# Patient Record
Sex: Female | Born: 1990 | Race: White | Hispanic: No | Marital: Single | Smoking: Current every day smoker
Health system: Southern US, Community
[De-identification: ages and names within clinical notes are randomized; demographics above are authoritative.]

## PROBLEM LIST (undated history)

## (undated) DIAGNOSIS — F32A Depression, unspecified: Secondary | ICD-10-CM

## (undated) DIAGNOSIS — F329 Major depressive disorder, single episode, unspecified: Secondary | ICD-10-CM

---

## 2012-01-25 ENCOUNTER — Emergency Department: Payer: Self-pay | Admitting: *Deleted

## 2012-01-25 LAB — URINALYSIS, COMPLETE
Bilirubin,UR: NEGATIVE
Nitrite: NEGATIVE
Protein: 100
RBC,UR: 182 /HPF (ref 0–5)

## 2014-09-16 ENCOUNTER — Ambulatory Visit: Payer: Self-pay | Admitting: Internal Medicine

## 2015-07-18 ENCOUNTER — Emergency Department (HOSPITAL_BASED_OUTPATIENT_CLINIC_OR_DEPARTMENT_OTHER)
Admission: EM | Admit: 2015-07-18 | Discharge: 2015-07-18 | Disposition: A | Discharge status: Home / Self Care | Payer: Self-pay | Attending: Emergency Medicine | Admitting: Emergency Medicine

## 2015-07-18 ENCOUNTER — Encounter (HOSPITAL_BASED_OUTPATIENT_CLINIC_OR_DEPARTMENT_OTHER): Payer: Self-pay | Admitting: Emergency Medicine

## 2015-07-18 DIAGNOSIS — M542 Cervicalgia: Secondary | ICD-10-CM | POA: Insufficient documentation

## 2015-07-18 DIAGNOSIS — N390 Urinary tract infection, site not specified: Secondary | ICD-10-CM | POA: Insufficient documentation

## 2015-07-18 DIAGNOSIS — Z3202 Encounter for pregnancy test, result negative: Secondary | ICD-10-CM | POA: Insufficient documentation

## 2015-07-18 DIAGNOSIS — F329 Major depressive disorder, single episode, unspecified: Secondary | ICD-10-CM | POA: Insufficient documentation

## 2015-07-18 DIAGNOSIS — Z720 Tobacco use: Secondary | ICD-10-CM | POA: Insufficient documentation

## 2015-07-18 HISTORY — DX: Depression, unspecified: F32.A

## 2015-07-18 HISTORY — DX: Major depressive disorder, single episode, unspecified: F32.9

## 2015-07-18 LAB — PREGNANCY, URINE: Preg Test, Ur: NEGATIVE

## 2015-07-18 LAB — URINALYSIS, ROUTINE W REFLEX MICROSCOPIC
BILIRUBIN URINE: NEGATIVE
GLUCOSE, UA: NEGATIVE mg/dL
Ketones, ur: 15 mg/dL — AB
Nitrite: POSITIVE — AB
PH: 6 (ref 5.0–8.0)
Protein, ur: NEGATIVE mg/dL
Specific Gravity, Urine: 1.019 (ref 1.005–1.030)
Urobilinogen, UA: 0.2 mg/dL (ref 0.0–1.0)

## 2015-07-18 LAB — URINE MICROSCOPIC-ADD ON

## 2015-07-18 MED ORDER — LIDOCAINE HCL (PF) 1 % IJ SOLN
INTRAMUSCULAR | Status: AC
  Administered 2015-07-18: 2.1 mL
  Filled 2015-07-18: qty 5

## 2015-07-18 MED ORDER — CEPHALEXIN 500 MG PO CAPS: 500.0000 mg | ORAL_CAPSULE | Freq: Two times a day (BID) | ORAL | Status: DC

## 2015-07-18 MED ORDER — IBUPROFEN 800 MG PO TABS
800.0000 mg | ORAL_TABLET | Freq: Once | ORAL | Status: AC
  Administered 2015-07-18: 800 mg via ORAL
  Filled 2015-07-18: qty 1

## 2015-07-18 MED ORDER — CEFTRIAXONE SODIUM 1 G IJ SOLR
1.0000 g | Freq: Once | INTRAMUSCULAR | Status: AC
  Administered 2015-07-18: 1 g via INTRAMUSCULAR
  Filled 2015-07-18: qty 10

## 2015-07-18 NOTE — ED Provider Notes (Signed)
CSN: [id number]     Arrival date & time 07/18/15  2201 History  By signing my name below, I, Brenda Sandoval, attest that this documentation has been prepared under the direction and in the presence of Shon Batonourtney F Shariece Viveiros, MD. Electronically Signed: Murriel HopperAlec Sandoval, ED Scribe. 07/18/2015. 10:33 PM.    Chief Complaint  Patient presents with  . Back Pain      The history is provided by the patient. No language interpreter was used.   HPI Comments: Brenda Sandoval is a 24 y.o. female who presents to the Emergency Department complaining of constant, worsening 8/10 right-sided lower back pain. Denies dysuria or hematuria. Pt reports she has pain in her lower back with ambulation. Pt denies any tingling or numbness in association with her lower back pain. Pt denies sore throat, cough, congestion, dysuria, fever. Does report pain over the left side of her neck. Worse with movement. Feels like she might have swollen nodes. Pt states her last menstrual period was 10/14. Pt denies any current medical problems, and denies having any allergies to medications.   Past Medical History  Diagnosis Date  . Depression    History reviewed. No pertinent past surgical history. History reviewed. No pertinent family history. Social History  Substance Use Topics  . Smoking status: Current Every Day Smoker  . Smokeless tobacco: None  . Alcohol Use: No   OB History    No data available     Review of Systems  Constitutional: Negative for unexpected weight change.  HENT: Negative for congestion and sore throat.   Respiratory: Negative for choking.   Musculoskeletal: Positive for myalgias and neck pain.  Neurological: Negative for numbness.  All other systems reviewed and are negative.     Allergies  Review of patient's allergies indicates no known allergies.  Home Medications   Prior to Admission medications   Medication Sig Start Date End Date Taking? Authorizing Provider  Citalopram Hydrobromide  (CELEXA PO) Take by mouth.   Yes Historical Provider, MD  cephALEXin (KEFLEX) 500 MG capsule Take 1 capsule (500 mg total) by mouth 2 (two) times daily. 07/18/15   Shon Batonourtney F Saba Gomm, MD   BP 121/91 mmHg  Pulse 90  Temp(Src) 98 F (36.7 C) (Oral)  Resp 18  Ht 5\' 6"  (1.676 m)  Wt 147 lb (66.679 kg)  BMI 23.74 kg/m2  SpO2 100%  LMP 06/24/2015 Physical Exam  Constitutional: She is oriented to person, place, and time. She appears well-developed and well-nourished. No distress.  HENT:  Head: Normocephalic and atraumatic.  Mouth/Throat: Oropharynx is clear and moist.  Eyes: Pupils are equal, round, and reactive to light.  Neck: Normal range of motion. Neck supple.  No adenopathy noted, tenderness to palpation left neck, thyroid appears normal size  Cardiovascular: Normal rate, regular rhythm and normal heart sounds.   No murmur heard. Pulmonary/Chest: Effort normal and breath sounds normal. No respiratory distress. She has no wheezes.  Abdominal: Soft. There is no tenderness.  Genitourinary:  No CVA tenderness  Musculoskeletal:  No midline tenderness to palpation, no tenderness to palpation to the right paraspinous muscle region  Neurological: She is alert and oriented to person, place, and time.  Skin: Skin is warm and dry.  Psychiatric: She has a normal mood and affect.  Nursing note and vitals reviewed.   ED Course  Procedures (including critical care time)  DIAGNOSTIC STUDIES: Oxygen Saturation is 100% on room air, normal by my interpretation.    COORDINATION OF CARE: 10:32 PM  Discussed treatment plan with pt at bedside and pt agreed to plan.   Labs Review Labs Reviewed  URINALYSIS, ROUTINE W REFLEX MICROSCOPIC (NOT AT [facility]) - Abnormal; Notable for the following:    APPearance CLOUDY (*)    Hgb urine dipstick MODERATE (*)    Ketones, ur 15 (*)    Nitrite POSITIVE (*)    Leukocytes, UA MODERATE (*)    All other components within normal limits  URINE MICROSCOPIC-ADD ON  - Abnormal; Notable for the following:    Squamous Epithelial / LPF FEW (*)    Bacteria, UA MANY (*)    All other components within normal limits  URINE CULTURE  PREGNANCY, URINE    Imaging Review No results found. I have personally reviewed and evaluated these images and lab results as part of my medical decision-making.   EKG Interpretation None      MDM   Final diagnoses:  UTI (lower urinary tract infection)    Patient presents with lower back pain. Onset within the last 24 hours. Denies any other symptoms. Denies injury. Exam is largely unremarkable. Patient is also complaining of left neck pain. No masses or adenopathy noted. Patient denies any symptoms suggestive of new thyroid dysfunction. Urinalysis is nitrite positive with 2150 white cells and many bacteria. Patient's back pain could be related to UTI. She has no CVA tenderness and is afebrile. Low suspicion for pyelonephritis at this time. We'll send urine culture. Keflex twice a day for 7 days. Regarding patient's neck pain. She does not have a primary physician. We'll give the patient follow-up information for [facility].  After history, exam, and medical workup I feel the patient has been appropriately medically screened and is safe for discharge home. Pertinent diagnoses were discussed with the patient. Patient was given return precautions.  I personally performed the services described in this documentation, which was scribed in my presence. The recorded information has been reviewed and is accurate.     Shon Baton, MD 07/18/15 [phone]

## 2015-07-18 NOTE — Discharge Instructions (Signed)
You were seen today for back pain. U have evidence of a urinary tract infection. He will be treated with antibiotics. Otherwise complaining of swelling in her throat. Your thyroid gland does not appear enlarged; however, it is reasonable to have further evaluation with thyroid studies and potential thyroid imaging.  You will be given the number for [facility].  Urinary Tract Infection Urinary tract infections (UTIs) can develop anywhere along your urinary tract. Your urinary tract is your body's drainage system for removing wastes and extra water. Your urinary tract includes two kidneys, two ureters, a bladder, and a urethra. Your kidneys are a pair of bean-shaped organs. Each kidney is about the size of your fist. They are located below your ribs, one on each side of your spine. CAUSES Infections are caused by microbes, which are microscopic organisms, including fungi, viruses, and bacteria. These organisms are so small that they can only be seen through a microscope. Bacteria are the microbes that most commonly cause UTIs. SYMPTOMS  Symptoms of UTIs may vary by age and gender of the patient and by the location of the infection. Symptoms in young women typically include a frequent and intense urge to urinate and a painful, burning feeling in the bladder or urethra during urination. Older women and men are more likely to be tired, shaky, and weak and have muscle aches and abdominal pain. A fever may mean the infection is in your kidneys. Other symptoms of a kidney infection include pain in your back or sides below the ribs, nausea, and vomiting. DIAGNOSIS To diagnose a UTI, your caregiver will ask you about your symptoms. Your caregiver will also ask you to provide a urine sample. The urine sample will be tested for bacteria and white blood cells. White blood cells are made by your body to help fight infection. TREATMENT  Typically, UTIs can be treated with medication. Because most UTIs are caused by a  bacterial infection, they usually can be treated with the use of antibiotics. The choice of antibiotic and length of treatment depend on your symptoms and the type of bacteria causing your infection. HOME CARE INSTRUCTIONS  If you were prescribed antibiotics, take them exactly as your caregiver instructs you. Finish the medication even if you feel better after you have only taken some of the medication.  Drink enough water and fluids to keep your urine clear or pale yellow.  Avoid caffeine, tea, and carbonated beverages. They tend to irritate your bladder.  Empty your bladder often. Avoid holding urine for long periods of time.  Empty your bladder before and after sexual intercourse.  After a bowel movement, women should cleanse from front to back. Use each tissue only once. SEEK MEDICAL CARE IF:   You have back pain.  You develop a fever.  Your symptoms do not begin to resolve within 3 days. SEEK IMMEDIATE MEDICAL CARE IF:   You have severe back pain or lower abdominal pain.  You develop chills.  You have nausea or vomiting.  You have continued burning or discomfort with urination. MAKE SURE YOU:   Understand these instructions.  Will watch your condition.  Will get help right away if you are not doing well or get worse.   This information is not intended to replace advice given to you by your health care provider. Make sure you discuss any questions you have with your health care provider.   Document Released: 06/06/2005 Document Revised: 05/18/2015 Document Reviewed: 10/05/2011 Elsevier Interactive Patient Education [employer]2016 Elsevier Inc.

## 2015-07-18 NOTE — ED Notes (Signed)
Lower back pain started yesterday getting worse today, denies injury

## 2015-07-21 LAB — URINE CULTURE

## 2015-07-22 ENCOUNTER — Telehealth (HOSPITAL_BASED_OUTPATIENT_CLINIC_OR_DEPARTMENT_OTHER): Payer: Self-pay | Admitting: Emergency Medicine

## 2015-07-22 NOTE — Telephone Encounter (Signed)
Post ED Visit - Positive Culture Follow-up  Culture report reviewed by antimicrobial stewardship pharmacist:  []  Brenda Sandoval, Pharm.D. []  Brenda Sandoval, Pharm.D., BCPS []  Brenda Sandoval, Pharm.D. []  Brenda Sandoval, Pharm.D., BCPS []  MelbourneMinh Sandoval, [address] BoulevardPharm.D., BCPS, AAHIVP []  Brenda Sandoval, Pharm.D., BCPS, AAHIVP []  Brenda Sandoval, Pharm.D. [x]  Brenda Sandoval, [place].  Positive urine culture Klebsiella Treated with Cephalexin, organism sensitive to the same and no further patient follow-up is required at this time.  Brenda Sandoval, Brenda Sandoval 07/22/2015, 10:02 AM

## 2015-11-22 ENCOUNTER — Emergency Department (HOSPITAL_BASED_OUTPATIENT_CLINIC_OR_DEPARTMENT_OTHER)
Admission: EM | Admit: 2015-11-22 | Discharge: 2015-11-23 | Disposition: A | Discharge status: Home / Self Care | Payer: Self-pay | Attending: Emergency Medicine | Admitting: Emergency Medicine

## 2015-11-22 ENCOUNTER — Encounter (HOSPITAL_BASED_OUTPATIENT_CLINIC_OR_DEPARTMENT_OTHER): Payer: Self-pay | Admitting: *Deleted

## 2015-11-22 DIAGNOSIS — Z3202 Encounter for pregnancy test, result negative: Secondary | ICD-10-CM | POA: Insufficient documentation

## 2015-11-22 DIAGNOSIS — Z792 Long term (current) use of antibiotics: Secondary | ICD-10-CM | POA: Insufficient documentation

## 2015-11-22 DIAGNOSIS — F17210 Nicotine dependence, cigarettes, uncomplicated: Secondary | ICD-10-CM | POA: Insufficient documentation

## 2015-11-22 DIAGNOSIS — F329 Major depressive disorder, single episode, unspecified: Secondary | ICD-10-CM | POA: Insufficient documentation

## 2015-11-22 DIAGNOSIS — F419 Anxiety disorder, unspecified: Secondary | ICD-10-CM | POA: Insufficient documentation

## 2015-11-22 DIAGNOSIS — F32A Depression, unspecified: Secondary | ICD-10-CM

## 2015-11-22 LAB — RAPID URINE DRUG SCREEN, HOSP PERFORMED
Amphetamines: NOT DETECTED
BARBITURATES: NOT DETECTED
Benzodiazepines: POSITIVE — AB
Cocaine: NOT DETECTED
OPIATES: NOT DETECTED
Tetrahydrocannabinol: POSITIVE — AB

## 2015-11-22 LAB — PREGNANCY, URINE: Preg Test, Ur: NEGATIVE

## 2015-11-22 NOTE — ED Provider Notes (Signed)
CSN: [id number]     Arrival date & time 11/22/15  2201 History  By signing my name below, I, Bethel Born, attest that this documentation has been prepared under the direction and in the presence of Paula Libra, MD. Electronically Signed: Bethel Born, ED Scribe. 11/22/2015. 11:29 PM   Chief Complaint  Patient presents with  . Depression   The history is provided by the patient. No language interpreter was used.   Brenda Sandoval is a 25 y.o. female with history of anxiety and depression who presents to the Emergency Department complaining of worsening depression. She was diagnosed with depression and anxiety 3 years ago and took Celexa and Xanax for 8 months after her diagnoses but has been unable to afford the physician visits and medications for the past two years. Pt states that 3 nights ago she got drunk and realized how bad her depression had gotten.  Associated symptoms include difficulty finding pleasure in typically enjoyable activities, and insomnia. She has trouble falling asleep but she says in bed for most of the day. Pt denies SI, HI, hallucinations. abdominal pain, nausea, vomiting, and any other physical complaint.   Past Medical History  Diagnosis Date  . Depression    History reviewed. No pertinent past surgical history. No family history on file. Social History  Substance Use Topics  . Smoking status: Current Every Day Smoker -- 0.50 packs/day    Types: Cigarettes  . Smokeless tobacco: None  . Alcohol Use: No   OB History    No data available     Review of Systems  10 Systems reviewed and all are negative for acute change except as noted in the HPI.  Allergies  Review of patient's allergies indicates no known allergies.  Home Medications   Prior to Admission medications   Medication Sig Start Date End Date Taking? Authorizing Provider  cephALEXin (KEFLEX) 500 MG capsule Take 1 capsule (500 mg total) by mouth 2 (two) times daily. 07/18/15   Shon Baton, MD  Citalopram Hydrobromide (CELEXA PO) Take by mouth.    Historical Provider, MD   BP 137/71 mmHg  Pulse 68  Temp(Src) 98.9 F (37.2 C) (Oral)  Resp 18  Ht  (1.651 m)  Wt 130 lb (58.968 kg)  BMI 21.63 kg/m2  SpO2 100%  LMP 11/08/2015 Physical Exam  Nursing note and vitals reviewed. General: Well-developed, well-nourished female in no acute distress; appearance consistent with age of record HENT: normocephalic; atraumatic Eyes: pupils equal, round and reactive to light; extraocular muscles intact Neck: supple Heart: regular rate and rhythm; no murmurs, rubs or gallops Lungs: clear to auscultation bilaterally Abdomen: soft; nondistended; nontender; no masses or hepatosplenomegaly; bowel sounds present Extremities: No deformity; full range of motion; pulses normal Neurologic: Awake, alert and oriented; motor function intact in all extremities and symmetric; no facial droop Skin: Warm and dry Psychiatric: Depressed mood with congruent affect; No SI or HI  ED Course  Procedures (including critical care time) DIAGNOSTIC STUDIES: Oxygen Saturation is 100% on RA,  normal by my interpretation.    COORDINATION OF CARE: 11:27 PM Discussed treatment plan which includes lab work and TTS consult with pt at bedside and pt agreed to plan.    MDM   Nursing notes and vitals signs, including pulse oximetry, reviewed.  Summary of this visit's results, reviewed by myself:  Labs:  Results for orders placed or performed during the hospital encounter of 11/22/15 (from the past 24 hour(s))  Urine rapid drug  screen (hosp performed)     Status: Abnormal   Collection Time: 11/22/15 11:04 PM  Result Value Ref Range   Opiates NONE DETECTED NONE DETECTED   Cocaine NONE DETECTED NONE DETECTED   Benzodiazepines POSITIVE (A) NONE DETECTED   Amphetamines NONE DETECTED NONE DETECTED   Tetrahydrocannabinol POSITIVE (A) NONE DETECTED   Barbiturates NONE DETECTED NONE DETECTED   Pregnancy, urine     Status: None   Collection Time: 11/22/15 11:04 PM  Result Value Ref Range   Preg Test, Ur NEGATIVE NEGATIVE  CBC with Differential/Platelet     Status: Abnormal   Collection Time: 11/23/15  2:27 AM  Result Value Ref Range   WBC 8.6 4.0 - 10.5 K/uL   RBC 3.88 3.87 - 5.11 MIL/uL   Hemoglobin 11.9 (L) 12.0 - 15.0 g/dL   HCT 34.735.8 (L) 42.536.0 - 95.646.0 %   MCV 92.3 78.0 - 100.0 fL   MCH 30.7 26.0 - 34.0 pg   MCHC 33.2 30.0 - 36.0 g/dL   RDW 38.713.0 56.411.5 - 33.215.5 %   Platelets 263 150 - 400 K/uL   Neutrophils Relative % 64 %   Neutro Abs 5.6 1.7 - 7.7 K/uL   Lymphocytes Relative 28 %   Lymphs Abs 2.4 0.7 - 4.0 K/uL   Monocytes Relative 7 %   Monocytes Absolute 0.6 0.1 - 1.0 K/uL   Eosinophils Relative 1 %   Eosinophils Absolute 0.1 0.0 - 0.7 K/uL   Basophils Relative 0 %   Basophils Absolute 0.0 0.0 - 0.1 K/uL  Basic metabolic panel     Status: Abnormal   Collection Time: 11/23/15  2:27 AM  Result Value Ref Range   Sodium 137 135 - 145 mmol/L   Potassium 3.5 3.5 - 5.1 mmol/L   Chloride 105 101 - 111 mmol/L   CO2 23 22 - 32 mmol/L   Glucose, Bld 101 (H) 65 - 99 mg/dL   BUN 10 6 - 20 mg/dL   Creatinine, Ser 9.510.56 0.44 - 1.00 mg/dL   Calcium 8.8 (L) 8.9 - 10.3 mg/dL   GFR calc non Af Amer >60 >60 mL/min   GFR calc Af Amer >60 >60 mL/min   Anion gap 9 5 - 15  Ethanol     Status: None   Collection Time: 11/23/15  2:27 AM  Result Value Ref Range   Alcohol, Ethyl (B) <5 <5 mg/dL   8:844:37 AM The physician assistant from TTS who evaluated the patient elicited an admission that the patient, when intoxicated 3 nights ago, apparently tied a cord to a light fixture. The patient does not recall doing this but does recall finding the cord the next morning. Involuntary commitment was advised by the physician assistant. On further evaluation of the patient by myself she repeatedly denies any current thoughts of suicide or self-harm. She repeats that her reason for coming was to get  restarted on her antidepressant and for referral to an appropriate behavioral health professional.  I do not believe that the patient requires involuntary commitment. We will have her contract for safety. We will restart her on Celexa and provide outpatient psychiatric resources.   I personally performed the services described in this documentation, which was scribed in my presence. The recorded information has been reviewed and is accurate.   Paula LibraJohn Demetrios Byron, MD 11/23/15 [phone]

## 2015-11-22 NOTE — ED Notes (Signed)
Depressed for over a year. Denies suicide. States she is not sleeping and has increased anxiety. Drove herself here. States she has a hx of depression. She has been off depression medication x 2 years due to no insurance.

## 2015-11-23 LAB — CBC WITH DIFFERENTIAL/PLATELET
BASOS PCT: 0 %
Basophils Absolute: 0 10*3/uL (ref 0.0–0.1)
EOS ABS: 0.1 10*3/uL (ref 0.0–0.7)
Eosinophils Relative: 1 %
HEMATOCRIT: 35.8 % — AB (ref 36.0–46.0)
HEMOGLOBIN: 11.9 g/dL — AB (ref 12.0–15.0)
LYMPHS ABS: 2.4 10*3/uL (ref 0.7–4.0)
Lymphocytes Relative: 28 %
MCH: 30.7 pg (ref 26.0–34.0)
MCHC: 33.2 g/dL (ref 30.0–36.0)
MCV: 92.3 fL (ref 78.0–100.0)
MONO ABS: 0.6 10*3/uL (ref 0.1–1.0)
MONOS PCT: 7 %
NEUTROS ABS: 5.6 10*3/uL (ref 1.7–7.7)
NEUTROS PCT: 64 %
Platelets: 263 10*3/uL (ref 150–400)
RBC: 3.88 MIL/uL (ref 3.87–5.11)
RDW: 13 % (ref 11.5–15.5)
WBC: 8.6 10*3/uL (ref 4.0–10.5)

## 2015-11-23 LAB — ETHANOL: Alcohol, Ethyl (B): <5 mg/dL (ref <5)

## 2015-11-23 LAB — BASIC METABOLIC PANEL
Anion gap: 9 (ref 5–15)
BUN: 10 mg/dL (ref 6–20)
CALCIUM: 8.8 mg/dL — AB (ref 8.9–10.3)
CHLORIDE: 105 mmol/L (ref 101–111)
CO2: 23 mmol/L (ref 22–32)
CREATININE: 0.56 mg/dL (ref 0.44–1.00)
GFR calc non Af Amer: >60 mL/min (ref >60)
Glucose, Bld: 101 mg/dL — ABNORMAL HIGH (ref 65–99)
Potassium: 3.5 mmol/L (ref 3.5–5.1)
Sodium: 137 mmol/L (ref 135–145)

## 2015-11-23 MED ORDER — CITALOPRAM HYDROBROMIDE 40 MG PO TABS: 20.0000 mg | ORAL_TABLET | Freq: Every day | ORAL | Status: AC

## 2015-11-23 NOTE — ED Notes (Signed)
TTS called and computer placed in room by Andria FramesBobbie, Charge nurse.

## 2015-11-23 NOTE — BH Assessment (Signed)
Clinician consulted with EDP Dr. Read DriversMolpus regarding Brenda SievertSpencer Simon, PA recommendation for inpatient admission. EDP informed clinician that he did not feel comfortable w/IVC & inpatient admission.. EDP also explained that he believed pt would instead benefit from d/c w/ prescription and resources to community mental health tx. EDP requested medication recommendation from Brenda Sandoval, [place]. Clinician communicated EDPs concerns to PA who explained that he was unable to provide requested recommendation. PA did recommend pt be transferred to obs unit for AM Psych [name]. Clinician informed EDP of PA recommendation.

## 2015-11-23 NOTE — ED Notes (Signed)
Pt given water per request. Psychiatry contacted to consult with Dr. Read DriversMolpus.

## 2015-11-23 NOTE — ED Notes (Signed)
Pt has signed "Contract for Safety." pt was given out patient resource guide for mental health assistance. Pt is agreeable to follow up with out patient services. Pt will be given rx for celexa by Dr. Read DriversMolpus and affirms she can afford medication. Pt calm, cooperative and agreeable to plan of care provided.

## 2015-11-23 NOTE — ED Notes (Signed)
Pt ambulatory to bathroom. Pt updated on reason for wait. Pt offered drink and snack but declined.

## 2015-11-23 NOTE — BH Assessment (Addendum)
Tele Assessment Note   Brenda Sandoval is an 25 y.o. female. Presenting to ED with c/o MDD and GAD w/panic attacks. Pt reports struggling with depression for the last 4 years. Pt states "I had a small episode last night". Upon seeking clarification, pt stated that three days ago while intoxicated (unknown amount consumed) pt attempted to hang herself from a ceiling fan. Pt states that she cannot recall what stopped her and that she can only remember select parts of the night. Pt reports that the "string" was still hanging from the ceiling fan the next morning.   Pt states that the attempt made her realize how serious her MDD is and that she need to seek assistance. Pt reports an increase in depressive and vegetative sxs. Pt states that she laid in the bed for approx.. 12hrs today before mustering up the motivation to bring herself to the hospital.  Pt denies alcohol abuse and reports consumption of "3 or 4 shots" 2-3 days a week. Pt did report h/o blackouts, increased drinking to cope with MDD, legal consequences of alcohol use and consuming more amounts than intentional. Pt also attributes recent suicide attempt to intoxication. Pt reports fluctuations in weight within the last year (185lbs, 108lbs, 150lbs, 120lbs).  Pt reports poor appetite and difficulty sleeping. Pt reports current non-compliance with medications due to lack of insurance and financial difficulties. Pt reports that weight fluctuations were not intentional. Pt also reports unresolved fillings regarding an abortion she received last month.  Alcohol- pt denies however, reports blackouts, increased drinking, legal consequences and recent suicide attempt which pt attributes to being intoxicated  Diagnosis: MDD, GAD w/ anxiety attacks (per pt report), Alcohol Use  Past Medical History:  Past Medical History  Diagnosis Date  . Depression     History reviewed. No pertinent past surgical history.  Family History: No family history on  file.  Social History:  reports that she has been smoking Cigarettes.  She has been smoking about 0.50 packs per day. She does not have any smokeless tobacco history on file. She reports that she does not drink alcohol or use illicit drugs.  Additional Social History:  Alcohol / Drug Use Pain Medications: None Reported Prescriptions: Not Compliant Over the Counter: None Reported History of alcohol / drug use?: Yes Longest period of sobriety (when/how long): Not Reported Negative Consequences of Use: Legal Substance #1 Name of Substance 1: Alcohol- pt denies however, reports blackouts, increased drinking, legal consequences and recent suicide attempt which pt attributes to being intoxicated 1 - Age of First Use: Not Reported 1 - Amount (size/oz): "3 or 4 shots" 1 - Frequency: 2x/wk 1 - Duration: Ongoing 1 - Last Use / Amount: Not Reported  CIWA: CIWA-Ar BP: 137/71 mmHg Pulse Rate: 68 COWS:    PATIENT STRENGTHS: (choose at least two) Average or above average intelligence Capable of independent living Physical Health  Allergies: No Known Allergies  Home Medications:  (Not in a hospital admission)  OB/GYN Status:  Patient's last menstrual period was 11/08/2015.  General Assessment Data TTS Assessment: In system Is this a Tele or Face-to-Face Assessment?: Tele Assessment Is this an Initial Assessment or a Re-assessment for this encounter?: Initial Assessment Marital status: Single Is patient pregnant?: No Pregnancy Status: No Living Arrangements: Non-relatives/Friends Can pt return to current living arrangement?: Yes Admission Status: Voluntary Is patient capable of signing voluntary admission?: Yes Referral Source: Self/Family/Friend Insurance type: None     Crisis Care Plan Living Arrangements: Non-relatives/Friends Name of Psychiatrist:  None Name of Therapist: None  Education Status Is patient currently in school?: No Highest grade of school patient has  completed: Not Reported  Risk to self with the past 6 months Suicidal Ideation: No-Not Currently/Within Last 6 Months (Pt previous reports SI w/ attempt  to hang self 3 days ago) Has patient been a risk to self within the past 6 months prior to admission? : Yes (Pt previous reports SI w/ attempt  to hang self 3 days ago) Suicidal Intent: No-Not Currently/Within Last 6 Months (Pt previous reports SI w/ attempt  to hang self 3 days ago) Has patient had any suicidal intent within the past 6 months prior to admission? : Yes (Pt previous reports SI w/ attempt  to hang self 3 days ago) Is patient at risk for suicide?: Yes Suicidal Plan?: No-Not Currently/Within Last 6 Months (Pt previous reports SI w/ attempt  to hang self 3 days ago) Has patient had any suicidal plan within the past 6 months prior to admission? : Yes (Pt previous reports SI w/ attempt  to hang self 3 days ago) Access to Means: Yes Specify Access to Suicidal Means: Pt previous reports SI w/ attempt  to hang self 3 days ago What has been your use of drugs/alcohol within the last 12 months?: PT reports increased drinking within the last year and that she was intoxicated during attempt Previous Attempts/Gestures: Yes How many times?: 1 Other Self Harm Risks: MDD, non compliant with meds, recent attempt Intentional Self Injurious Behavior: None Family Suicide History: Yes (attempt by distant relative) Recent stressful life event(s): Financial Problems Persecutory voices/beliefs?: No Depression: Yes Depression Symptoms: Isolating, Fatigue, Insomnia, Tearfulness, Feeling worthless/self pity, Loss of interest in usual pleasures (Hopeless) Substance abuse history and/or treatment for substance abuse?: No Suicide prevention information given to non-admitted patients: Not applicable  Risk to Others within the past 6 months Homicidal Ideation: No Does patient have any lifetime risk of violence toward others beyond the six months prior to  admission? : No Thoughts of Harm to Others: No Current Homicidal Intent: No Current Homicidal Plan: No Access to Homicidal Means: No History of harm to others?: No Assessment of Violence: None Noted Does patient have access to weapons?: No Criminal Charges Pending?: No Does patient have a court date: No Is patient on probation?: Yes (Unsupervised 981yr- probation DUI )  Psychosis Hallucinations: None noted Delusions: None noted  Mental Status Report Appearance/Hygiene: In scrubs Eye Contact: Good Motor Activity: Unremarkable Speech: Logical/coherent Level of Consciousness: Alert Mood: Depressed, Anxious Affect: Appropriate to circumstance Anxiety Level: Minimal Thought Processes: Coherent, Relevant Judgement: Unimpaired Orientation: Person, Place, Situation, Time, Appropriate for developmental age Obsessive Compulsive Thoughts/Behaviors: None  Cognitive Functioning Concentration: Normal Memory: Recent Intact, Remote Intact IQ: Average Insight: Fair Impulse Control: Poor Appetite: Poor Weight Loss: 77 (fluctuations w/in last year 185-108-150-120) Weight Gain:  (fluctuations w/in last year 185-108-150-120) Sleep: Decreased  ADLScreening [facility] Assessment Services) Patient's cognitive ability adequate to safely complete daily activities?: Yes Patient able to express need for assistance with ADLs?: Yes Independently performs ADLs?: Yes (appropriate for developmental age)  Prior Inpatient Therapy Prior Inpatient Therapy: No  Prior Outpatient Therapy Prior Outpatient Therapy: No Does patient have an ACCT team?: No Does patient have Intensive In-House Services?  : No Does patient have [facility] services? : No Does patient have P4CC services?: No  ADL Screening (condition at time of admission) Patient's cognitive ability adequate to safely complete daily activities?: Yes Is the patient deaf or have difficulty hearing?:  No Does the patient have difficulty seeing, even when  wearing glasses/contacts?:  (PT reports that she is in need of eyeglasses) Does the patient have difficulty concentrating, remembering, or making decisions?: No Patient able to express need for assistance with ADLs?: Yes Does the patient have difficulty dressing or bathing?: No Independently performs ADLs?: Yes (appropriate for developmental age) Does the patient have difficulty walking or climbing stairs?: No Weakness of Legs: None Weakness of Arms/Hands: None  Home Assistive Devices/Equipment Home Assistive Devices/Equipment: None  Therapy Consults (therapy consults require a physician order) PT Evaluation Needed: No OT Evalulation Needed: No SLP Evaluation Needed: No Abuse/Neglect Assessment (Assessment to be complete while patient is alone) Physical Abuse: Denies Verbal Abuse: Denies Sexual Abuse: Denies Exploitation of patient/patient's resources: Denies Self-Neglect: Denies Values / Beliefs Cultural Requests During Hospitalization: None Spiritual Requests During Hospitalization: None Consults Spiritual Care Consult Needed: No Social Work Consult Needed: No Merchant navy officer (For Healthcare) Does patient have an advance directive?: No Would patient like information on creating an advanced directive?: No - patient declined information    Additional Information 1:1 In Past 12 Months?: No CIRT Risk: No Elopement Risk: No Does patient have medical clearance?: Yes     Disposition: Donell Sievert, PA recommends inpatient placement once medically cleared. Clint Bolder, [facility] is reviewing pt chart for possible [facility] placement. Per [facility] Delorise Jackson), pt RN Reita Cliche) has been informed of pt need for medical clearance/lab results. RN has also been informed that pt stated during assessment that she is unwilling to voluntarily admit herself for inpatient tx. EDP Dr.Molpus has also been informed of pt disposition.   Disposition Initial Assessment Completed for this Encounter: Yes Disposition of  Patient: Other dispositions (Pending Psych Extender Recommendation)  Shantel Helwig J [place] 11/23/2015 2:02 AM

## 2015-11-23 NOTE — ED Notes (Signed)
Tele-psych contacted and will be available approximately 1-1.5 hours from now.

## 2015-11-23 NOTE — ED Notes (Addendum)
TTS in progress 

## 2017-04-28 ENCOUNTER — Emergency Department (HOSPITAL_BASED_OUTPATIENT_CLINIC_OR_DEPARTMENT_OTHER)
Admission: EM | Admit: 2017-04-28 | Discharge: 2017-04-29 | Disposition: A | Discharge status: Home / Self Care | Payer: Self-pay | Attending: Emergency Medicine | Admitting: Emergency Medicine

## 2017-04-28 ENCOUNTER — Encounter (HOSPITAL_BASED_OUTPATIENT_CLINIC_OR_DEPARTMENT_OTHER): Payer: Self-pay | Admitting: Emergency Medicine

## 2017-04-28 DIAGNOSIS — A599 Trichomoniasis, unspecified: Secondary | ICD-10-CM

## 2017-04-28 DIAGNOSIS — Z79899 Other long term (current) drug therapy: Secondary | ICD-10-CM | POA: Insufficient documentation

## 2017-04-28 DIAGNOSIS — A5901 Trichomonal vulvovaginitis: Secondary | ICD-10-CM | POA: Insufficient documentation

## 2017-04-28 DIAGNOSIS — F17210 Nicotine dependence, cigarettes, uncomplicated: Secondary | ICD-10-CM | POA: Insufficient documentation

## 2017-04-28 LAB — URINALYSIS, ROUTINE W REFLEX MICROSCOPIC
Bilirubin Urine: NEGATIVE
Glucose, UA: NEGATIVE mg/dL
Ketones, ur: NEGATIVE mg/dL
Nitrite: NEGATIVE
Protein, ur: NEGATIVE mg/dL
SPECIFIC GRAVITY, URINE: 1.003 — AB (ref 1.005–1.030)
pH: 6.5 (ref 5.0–8.0)

## 2017-04-28 LAB — WET PREP, GENITAL
Clue Cells Wet Prep HPF POC: NONE SEEN
Sperm: NONE SEEN
Yeast Wet Prep HPF POC: NONE SEEN

## 2017-04-28 LAB — URINALYSIS, MICROSCOPIC (REFLEX)

## 2017-04-28 LAB — PREGNANCY, URINE: PREG TEST UR: NEGATIVE

## 2017-04-28 MED ORDER — CEFTRIAXONE SODIUM 250 MG IJ SOLR
250.0000 mg | Freq: Once | INTRAMUSCULAR | Status: AC
  Administered 2017-04-29: 250 mg via INTRAMUSCULAR
  Filled 2017-04-28: qty 250

## 2017-04-28 MED ORDER — METRONIDAZOLE 500 MG PO TABS
2000.0000 mg | ORAL_TABLET | Freq: Once | ORAL | Status: AC
  Administered 2017-04-29: 2000 mg via ORAL
  Filled 2017-04-28: qty 4

## 2017-04-28 MED ORDER — AZITHROMYCIN 250 MG PO TABS
1000.0000 mg | ORAL_TABLET | Freq: Once | ORAL | Status: AC
  Administered 2017-04-29: 1000 mg via ORAL
  Filled 2017-04-28: qty 4

## 2017-04-28 NOTE — ED Triage Notes (Signed)
Patient states that she has a white thick vaginal discharge that leads her to believe that she has had a yeast infection for the last 3 months. Reports that she will have the d/c for 3 - 4 days and it will go away, and come

## 2017-04-28 NOTE — ED Provider Notes (Signed)
[facility] Note   CSN: [id number] Arrival date & time: 04/28/17  2124  By signing my name below, I, Diona Browner, attest that this documentation has been prepared under the direction and in the presence of Demisha Nokes, Canary Brim, MD. Electronically Signed: Diona Browner, ED Scribe. 04/28/17. 11:25 PM.  History   Chief Complaint Chief Complaint  Patient presents with  . Vaginal Itching    HPI Brenda Sandoval is a 26 y.o. female who presents to the Emergency Department complaining of persistent, thick, white, vaginal discharge for the last three months. Pt thinks she has a yeast infection. Associated sx include intermittent vaginal itching. No modifying factors noted. She notes normal menstrual cycles, however, her last cycle was shorter than normal. Pt denies dysuria, hematuria, genital sores, or any other complaints at this time.   The history is provided by the patient. No language interpreter was used.    Past Medical History:  Diagnosis Date  . Depression     There are no active problems to display for this patient.   History reviewed. No pertinent surgical history.  OB History    No data available       Home Medications    Prior to Admission medications   Medication Sig Start Date End Date Taking? Authorizing Provider  citalopram (CELEXA) 40 MG tablet Take 0.5 tablets (20 mg total) by mouth daily. 11/23/15   Molpus, Jonny Ruiz, MD    Family History History reviewed. No pertinent family history.  Social History Social History  Substance Use Topics  . Smoking status: Current Every Day Smoker    Packs/day: 0.50    Types: Cigarettes  . Smokeless tobacco: Never Used  . Alcohol use No     Allergies   Patient has no known allergies.   Review of Systems Review of Systems  Constitutional: Negative for chills and fever.  Genitourinary: Positive for vaginal discharge. Negative for dysuria and hematuria.  All other systems reviewed and  are negative.    Physical Exam Updated Vital Signs BP (!) 145/98 (BP Location: Right Arm)   Pulse (!) 105   Temp 98.5 F (36.9 C) (Oral)   Resp 18   Ht 5\' 6"  (1.676 m)   Wt 59 kg (130 lb)   LMP 04/14/2017   SpO2 100%   BMI 20.98 kg/m   Physical Exam  Constitutional: She is oriented to person, place, and time. She appears well-developed and well-nourished. No distress.  HENT:  Head: Normocephalic and atraumatic.  Right Ear: Hearing normal.  Left Ear: Hearing normal.  Nose: Nose normal.  Mouth/Throat: Oropharynx is clear and moist and mucous membranes are normal.  Eyes: Pupils are equal, round, and reactive to light. Conjunctivae and EOM are normal.  Neck: Normal range of motion. Neck supple.  Cardiovascular: Regular rhythm, S1 normal and S2 normal.  Exam reveals no gallop and no friction rub.   No murmur heard. Pulmonary/Chest: Effort normal and breath sounds normal. No respiratory distress. She exhibits no tenderness.  Abdominal: Soft. Normal appearance and bowel sounds are normal. There is no hepatosplenomegaly. There is no tenderness. There is no rebound, no guarding, no tenderness at McBurney's point and negative Murphy's sign. No hernia.  Genitourinary: Uterus normal. There is no rash on the right labia. There is no rash on the left labia. Cervix exhibits motion tenderness (slight). Cervix exhibits no friability. Right adnexum displays no mass and no tenderness. Left adnexum displays no mass and no tenderness. Vaginal discharge found.  Musculoskeletal:  Normal range of motion.  Neurological: She is alert and oriented to person, place, and time. She has normal strength. No cranial nerve deficit or sensory deficit. Coordination normal. GCS eye subscore is 4. GCS verbal subscore is 5. GCS motor subscore is 6.  Skin: Skin is warm, dry and intact. No rash noted. No cyanosis.  Psychiatric: She has a normal mood and affect. Her speech is normal and behavior is normal. Thought content  normal.  Nursing note and vitals reviewed.    ED Treatments / Results  DIAGNOSTIC STUDIES: Oxygen Saturation is 100% on RA, normal by my interpretation.   COORDINATION OF CARE: 11:25 PM-Discussed next steps with pt. Pt verbalized understanding and is agreeable with the plan.   Labs (all labs ordered are listed, but only abnormal results are displayed) Labs Reviewed  WET PREP, GENITAL - Abnormal; Notable for the following:       Result Value   Trich, Wet Prep PRESENT (*)    WBC, Wet Prep HPF POC MANY (*)    All other components within normal limits  URINALYSIS, ROUTINE W REFLEX MICROSCOPIC - Abnormal; Notable for the following:    APPearance CLOUDY (*)    Specific Gravity, Urine 1.003 (*)    Hgb urine dipstick TRACE (*)    Leukocytes, UA LARGE (*)    All other components within normal limits  URINALYSIS, MICROSCOPIC (REFLEX) - Abnormal; Notable for the following:    Bacteria, UA MANY (*)    Squamous Epithelial / LPF TOO NUMEROUS TO COUNT (*)    All other components within normal limits  PREGNANCY, URINE  GC/CHLAMYDIA PROBE AMP ([place]) NOT AT [facility]    EKG  EKG Interpretation None       Radiology No results found.  Procedures Procedures (including critical care time)  Medications Ordered in ED Medications  cefTRIAXone (ROCEPHIN) injection 250 mg (not administered)  azithromycin (ZITHROMAX) tablet 1,000 mg (not administered)  metroNIDAZOLE (FLAGYL) tablet 2,000 mg (not administered)     Initial Impression / Assessment and Plan / ED Course  I have reviewed the triage vital signs and the nursing notes.  Pertinent labs & imaging results that were available during my care of the patient were reviewed by me and considered in my medical decision making (see chart for details).     Patient presents with persistent vaginal discharge and intermittent itchiness, burning and irritation of the vagina for several months. She does not have abdominal pain. Abdominal  exam is nontender, benign. Urinalysis does not suggest infection. Pelvic exam revealed discharge with very slight cervical motion tenderness. Wet prep did show evidence of trichomonas and large amount of white blood cells. She'll be treated empirically with Rocephin, Zithromax and Flagyl.  Final Clinical Impressions(s) / ED Diagnoses   Final diagnoses:  Trichimoniasis    New Prescriptions New Prescriptions   No medications on file   I personally performed the services described in this documentation, which was scribed in my presence. The recorded information has been reviewed and is accurate.     Gilda Crease, MD 04/28/17 2352

## 2017-04-29 LAB — GC/CHLAMYDIA PROBE AMP (~~LOC~~) NOT AT ARMC
Chlamydia: NEGATIVE
NEISSERIA GONORRHEA: NEGATIVE

## 2017-07-01 ENCOUNTER — Emergency Department (HOSPITAL_BASED_OUTPATIENT_CLINIC_OR_DEPARTMENT_OTHER): Payer: No Typology Code available for payment source

## 2017-07-01 ENCOUNTER — Emergency Department (HOSPITAL_BASED_OUTPATIENT_CLINIC_OR_DEPARTMENT_OTHER)
Admission: EM | Admit: 2017-07-01 | Discharge: 2017-07-02 | Disposition: A | Discharge status: Home / Self Care | Payer: No Typology Code available for payment source | Attending: Emergency Medicine | Admitting: Emergency Medicine

## 2017-07-01 ENCOUNTER — Encounter (HOSPITAL_BASED_OUTPATIENT_CLINIC_OR_DEPARTMENT_OTHER): Payer: Self-pay | Admitting: *Deleted

## 2017-07-01 DIAGNOSIS — M542 Cervicalgia: Secondary | ICD-10-CM | POA: Diagnosis not present

## 2017-07-01 DIAGNOSIS — N760 Acute vaginitis: Secondary | ICD-10-CM | POA: Insufficient documentation

## 2017-07-01 DIAGNOSIS — A5901 Trichomonal vulvovaginitis: Secondary | ICD-10-CM

## 2017-07-01 DIAGNOSIS — N739 Female pelvic inflammatory disease, unspecified: Secondary | ICD-10-CM | POA: Diagnosis not present

## 2017-07-01 DIAGNOSIS — B9689 Other specified bacterial agents as the cause of diseases classified elsewhere: Secondary | ICD-10-CM

## 2017-07-01 DIAGNOSIS — M549 Dorsalgia, unspecified: Secondary | ICD-10-CM | POA: Insufficient documentation

## 2017-07-01 DIAGNOSIS — A5900 Urogenital trichomoniasis, unspecified: Secondary | ICD-10-CM | POA: Insufficient documentation

## 2017-07-01 DIAGNOSIS — M546 Pain in thoracic spine: Secondary | ICD-10-CM

## 2017-07-01 DIAGNOSIS — M791 Myalgia, unspecified site: Secondary | ICD-10-CM | POA: Insufficient documentation

## 2017-07-01 DIAGNOSIS — F17210 Nicotine dependence, cigarettes, uncomplicated: Secondary | ICD-10-CM | POA: Diagnosis not present

## 2017-07-01 DIAGNOSIS — M7918 Myalgia, other site: Secondary | ICD-10-CM

## 2017-07-01 DIAGNOSIS — E041 Nontoxic single thyroid nodule: Secondary | ICD-10-CM | POA: Diagnosis not present

## 2017-07-01 LAB — COMPREHENSIVE METABOLIC PANEL
ALK PHOS: 72 U/L (ref 38–126)
ALT: 23 U/L (ref 14–54)
AST: 39 U/L (ref 15–41)
Albumin: 4.4 g/dL (ref 3.5–5.0)
Anion gap: 9 (ref 5–15)
BUN: 13 mg/dL (ref 6–20)
CALCIUM: 9.3 mg/dL (ref 8.9–10.3)
CO2: 26 mmol/L (ref 22–32)
CREATININE: 0.75 mg/dL (ref 0.44–1.00)
Chloride: 99 mmol/L — ABNORMAL LOW (ref 101–111)
Glucose, Bld: 91 mg/dL (ref 65–99)
Potassium: 3.5 mmol/L (ref 3.5–5.1)
Sodium: 134 mmol/L — ABNORMAL LOW (ref 135–145)
TOTAL PROTEIN: 7.7 g/dL (ref 6.5–8.1)
Total Bilirubin: 0.4 mg/dL (ref 0.3–1.2)

## 2017-07-01 LAB — CBC WITH DIFFERENTIAL/PLATELET
BASOS PCT: 0 %
Basophils Absolute: 0 10*3/uL (ref 0.0–0.1)
EOS ABS: 0.1 10*3/uL (ref 0.0–0.7)
Eosinophils Relative: 1 %
HCT: 36.3 % (ref 36.0–46.0)
HEMOGLOBIN: 12.2 g/dL (ref 12.0–15.0)
Lymphocytes Relative: 19 %
Lymphs Abs: 1.6 10*3/uL (ref 0.7–4.0)
MCH: 31.9 pg (ref 26.0–34.0)
MCHC: 33.6 g/dL (ref 30.0–36.0)
MCV: 94.8 fL (ref 78.0–100.0)
Monocytes Absolute: 0.8 10*3/uL (ref 0.1–1.0)
Monocytes Relative: 9 %
NEUTROS PCT: 71 %
Neutro Abs: 6.1 10*3/uL (ref 1.7–7.7)
Platelets: 323 10*3/uL (ref 150–400)
RBC: 3.83 MIL/uL — AB (ref 3.87–5.11)
RDW: 12.7 % (ref 11.5–15.5)
WBC: 8.6 10*3/uL (ref 4.0–10.5)

## 2017-07-01 LAB — URINALYSIS, ROUTINE W REFLEX MICROSCOPIC
Bilirubin Urine: NEGATIVE
GLUCOSE, UA: NEGATIVE mg/dL
HGB URINE DIPSTICK: NEGATIVE
Ketones, ur: 15 mg/dL — AB
Nitrite: NEGATIVE
Protein, ur: 30 mg/dL — AB
SPECIFIC GRAVITY, URINE: 1.015 (ref 1.005–1.030)
pH: 8 (ref 5.0–8.0)

## 2017-07-01 LAB — LIPASE, BLOOD: Lipase: 31 U/L (ref 11–51)

## 2017-07-01 LAB — URINALYSIS, MICROSCOPIC (REFLEX)

## 2017-07-01 LAB — PREGNANCY, URINE: PREG TEST UR: NEGATIVE

## 2017-07-01 MED ORDER — IOPAMIDOL (ISOVUE-300) INJECTION 61%
100.0000 mL | Freq: Once | INTRAVENOUS | Status: AC | PRN
  Administered 2017-07-01: 100 mL via INTRAVENOUS

## 2017-07-01 MED ORDER — MORPHINE SULFATE (PF) 4 MG/ML IV SOLN
4.0000 mg | Freq: Once | INTRAVENOUS | Status: AC
  Administered 2017-07-01: 4 mg via INTRAVENOUS
  Filled 2017-07-01: qty 1
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
## 2017-07-01 NOTE — Discharge Instructions (Addendum)
Please take time to read the instructions in the paperwork.   Please obtain a primary care doctor.  You need to follow-up regarding your thyroid nodule.  I also highly recommend that you get tested for other sexually transmitted infections including HIV, hepatitis.  Your sexual partners need to be notified as they need treatment for trichomoniasis (trich).  Please take Ibuprofen (Advil, motrin) and Tylenol (acetaminophen) to relieve your pain.  You may take up to 600 MG (3 pills) of normal strength ibuprofen every 8 hours as needed.  In between doses of ibuprofen you make take tylenol, up to 1,000 mg (two extra strength pills).  Do not take more than 3,000 mg tylenol in a 24 hour period.  Please check all medication labels as many medications such as pain and cold medications may contain tylenol.  Do not drink alcohol while taking these medications.  Do not take other NSAID'S while taking ibuprofen (such as aleve or naproxen).  Please take ibuprofen with food to decrease stomach upset.  Today you received medications that may make you sleepy or impair your ability to make decisions.  For the next 24 hours please do not drive, operate heavy machinery, care for a small child with out another adult present, or perform any activities that may cause harm to you or someone else if you were to fall asleep or be impaired.   You are being prescribed a medication which may make you sleepy. Please follow up of listed precautions for at least 24 hours after taking one dose.  The best way to get rid of muscle pain is by taking NSAIDS, using heat, massage therapy, and gentle stretching/range of motion exercises.  Today your diagnosed with bacterial vaginosis and trichomonas and received treatment metronidazole also known as Flagyl. It is very important that you do not consume any alcohol while taking this medication as it will cause you to become violently ill.  Doxycycline can cause you to be sensitive to the sun  and you will sunburn very easily.  You may have diarrhea from the antibiotics.  It is very important that you continue to take the antibiotics even if you get diarrhea unless a medical professional tells you that you may stop taking them.  If you stop too early the bacteria you are being treated for will become stronger and you may need different, more powerful antibiotics that have more side effects and worsening diarrhea.  Please stay well hydrated and consider probiotics as they may decrease the severity of your diarrhea.  Please be aware that if you take any hormonal contraception (birth control pills, nexplanon, the ring, etc) that your birth control will not work while you are taking antibiotics and you need to use back up protection as directed on the birth control medication information insert.
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
## 2017-07-02 LAB — WET PREP, GENITAL
Sperm: NONE SEEN
Yeast Wet Prep HPF POC: NONE SEEN

## 2017-07-02 MED ORDER — METRONIDAZOLE 500 MG PO TABS
2000.0000 mg | ORAL_TABLET | Freq: Once | ORAL | Status: AC
  Administered 2017-07-02: 2000 mg via ORAL
  Filled 2017-07-02: qty 4

## 2017-07-02 MED ORDER — CEFTRIAXONE SODIUM 250 MG IJ SOLR
250.0000 mg | Freq: Once | INTRAMUSCULAR | Status: AC
  Administered 2017-07-02: 250 mg via INTRAMUSCULAR
  Filled 2017-07-02: qty 250

## 2017-07-02 MED ORDER — CYCLOBENZAPRINE HCL 10 MG PO TABS: 10.0000 mg | ORAL_TABLET | Freq: Every evening | ORAL | 0 refills | Status: AC | PRN

## 2017-07-02 MED ORDER — CLINDAMYCIN HCL 300 MG PO CAPS: 300.0000 mg | ORAL_CAPSULE | Freq: Two times a day (BID) | ORAL | 0 refills | Status: AC

## 2017-07-02 MED ORDER — ONDANSETRON HCL 8 MG PO TABS: 4.0000 mg | ORAL_TABLET | Freq: Once | ORAL | Status: DC

## 2017-07-02 MED ORDER — CYCLOBENZAPRINE HCL 10 MG PO TABS
10.0000 mg | ORAL_TABLET | Freq: Once | ORAL | Status: AC
Start: 2017-07-02 — End: 2017-07-02
  Administered 2017-07-02: 10 mg via ORAL
  Filled 2017-07-02: qty 1

## 2017-07-02 MED ORDER — DOXYCYCLINE HYCLATE 100 MG PO CAPS: 100.0000 mg | ORAL_CAPSULE | Freq: Two times a day (BID) | ORAL | 0 refills | Status: AC

## 2017-07-02 MED ORDER — ONDANSETRON 4 MG PO TBDP
4.0000 mg | ORAL_TABLET | Freq: Once | ORAL | Status: AC
  Administered 2017-07-02: 4 mg via ORAL
  Filled 2017-07-02: qty 1

## 2017-07-03 LAB — GC/CHLAMYDIA PROBE AMP (~~LOC~~) NOT AT ARMC
Chlamydia: NEGATIVE
Neisseria Gonorrhea: NEGATIVE
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
